# Patient Record
Sex: Male | Born: 2000 | Race: White | Hispanic: No | Marital: Single | Smoking: Current some day smoker
Health system: Southern US, Community
[De-identification: ages and names within clinical notes are randomized; demographics above are authoritative.]

## PROBLEM LIST (undated history)

## (undated) HISTORY — PX: HAND SURGERY: SHX662

---
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
## 2020-03-10 ENCOUNTER — Emergency Department (HOSPITAL_COMMUNITY): Payer: Commercial Managed Care - PPO

## 2020-03-10 ENCOUNTER — Other Ambulatory Visit: Payer: Self-pay

## 2020-03-10 ENCOUNTER — Emergency Department (HOSPITAL_COMMUNITY)
Admission: EM | Admit: 2020-03-10 | Discharge: 2020-03-10 | Disposition: A | Discharge status: Home / Self Care | Payer: Commercial Managed Care - PPO | Attending: Emergency Medicine | Admitting: Emergency Medicine

## 2020-03-10 ENCOUNTER — Encounter (HOSPITAL_COMMUNITY): Payer: Self-pay | Admitting: *Deleted

## 2020-03-10 DIAGNOSIS — F17210 Nicotine dependence, cigarettes, uncomplicated: Secondary | ICD-10-CM | POA: Insufficient documentation

## 2020-03-10 DIAGNOSIS — R0789 Other chest pain: Secondary | ICD-10-CM | POA: Insufficient documentation

## 2020-03-10 LAB — CBC WITH DIFFERENTIAL/PLATELET
Abs Immature Granulocytes: 0.02 10*3/uL (ref 0.00–0.07)
Basophils Absolute: 0.1 10*3/uL (ref 0.0–0.1)
Basophils Relative: 1 %
Eosinophils Absolute: 0.2 10*3/uL (ref 0.0–0.5)
Eosinophils Relative: 2 %
HCT: 44.8 % (ref 39.0–52.0)
Hemoglobin: 15.5 g/dL (ref 13.0–17.0)
Immature Granulocytes: 0 %
Lymphocytes Relative: 37 %
Lymphs Abs: 3.1 10*3/uL (ref 0.7–4.0)
MCH: 29.9 pg (ref 26.0–34.0)
MCHC: 34.6 g/dL (ref 30.0–36.0)
MCV: 86.3 fL (ref 80.0–100.0)
Monocytes Absolute: 0.8 10*3/uL (ref 0.1–1.0)
Monocytes Relative: 9 %
Neutro Abs: 4.4 10*3/uL (ref 1.7–7.7)
Neutrophils Relative %: 51 %
Platelets: 288 10*3/uL (ref 150–400)
RBC: 5.19 MIL/uL (ref 4.22–5.81)
RDW: 11.7 % (ref 11.5–15.5)
WBC: 8.6 10*3/uL (ref 4.0–10.5)
nRBC: 0 % (ref 0.0–0.2)

## 2020-03-10 LAB — TROPONIN I (HIGH SENSITIVITY): Troponin I (High Sensitivity): 4 ng/L (ref <18)

## 2020-03-10 LAB — BASIC METABOLIC PANEL
Anion gap: 11 (ref 5–15)
BUN: 13 mg/dL (ref 6–20)
CO2: 25 mmol/L (ref 22–32)
Calcium: 9.2 mg/dL (ref 8.9–10.3)
Chloride: 101 mmol/L (ref 98–111)
Creatinine, Ser: 0.89 mg/dL (ref 0.61–1.24)
GFR calc Af Amer: >60 mL/min (ref >60)
GFR calc non Af Amer: >60 mL/min (ref >60)
Glucose, Bld: 94 mg/dL (ref 70–99)
Potassium: 3.6 mmol/L (ref 3.5–5.1)
Sodium: 137 mmol/L (ref 135–145)

## 2020-03-10 MED ORDER — ACETAMINOPHEN 500 MG PO TABS: 500.0000 mg | ORAL_TABLET | Freq: Four times a day (QID) | ORAL | 0 refills | Status: AC | PRN

## 2020-03-10 MED ORDER — ONDANSETRON HCL 4 MG PO TABS: 4.0000 mg | ORAL_TABLET | Freq: Three times a day (TID) | ORAL | 0 refills | Status: AC | PRN

## 2020-03-10 MED ORDER — LIDOCAINE 4 % EX CREA: 1.0000 "application " | TOPICAL_CREAM | Freq: Three times a day (TID) | CUTANEOUS | 0 refills | Status: AC | PRN

## 2020-03-10 MED ORDER — NAPROXEN 250 MG PO TABS
500.0000 mg | ORAL_TABLET | Freq: Once | ORAL | Status: AC
Start: 2020-03-10 — End: 2020-03-10
  Administered 2020-03-10: 500 mg via ORAL
  Filled 2020-03-10: qty 2

## 2020-03-10 MED ORDER — PREDNISONE 10 MG PO TABS: 20.0000 mg | ORAL_TABLET | Freq: Two times a day (BID) | ORAL | 0 refills | Status: AC

## 2020-03-10 MED ORDER — ONDANSETRON 4 MG PO TBDP
4.0000 mg | ORAL_TABLET | Freq: Once | ORAL | Status: AC
  Administered 2020-03-10: 4 mg via ORAL
  Filled 2020-03-10: qty 1

## 2020-03-10 NOTE — Discharge Instructions (Signed)
1. Medications: Take steroid taper as prescribed with food to avoid upset stomach issues.  Do not take ibuprofen, Advil, Aleve, or Motrin while taking this medicine.  You may take [phone] mg of Tylenol every 6 hours as needed for pain. Do not exceed 4000 mg of Tylenol daily.  When you are done taking the steroid taper then you can start alternating 600 mg of ibuprofen and [phone] mg of Tylenol every 3 hours as needed for pain. Do not exceed 4000 mg of Tylenol daily.  You can take Zofran as needed for vomiting. 2. Treatment: rest, apply ice or heat whichever feels best 20 minutes at a time a few times daily.  Do some gentle stretching.  You can apply lidocaine cream or other topical creams such as Salonpas or icy hot. 3. Follow Up: Please followup with  PCP in 1 week if no improvement for discussion of your diagnoses and further evaluation after today's visit; if you do not have a primary care doctor use the resource guide provided to find one; Please return to the ER for worsening symptoms or other concerns such as fevers, severe pain, shortness of breath, persistent vomiting

## 2020-03-10 NOTE — ED Notes (Signed)
Blood drawn via 23 gauge butterfly from R ac.  Blood drawn and walked to the lab

## 2020-03-10 NOTE — ED Provider Notes (Signed)
[facility] EMERGENCY DEPARTMENT Provider Note   CSN: [id number] Arrival date & time: 03/10/20  1709     History Chief Complaint  Patient presents with  . Chest Pain    Grant Drake is a 19 y.o. male with history of major depressive disorder presenting for evaluation of acute onset, intermittent left-sided chest pains for 2 weeks.  He reports that the pain is sharp, begins along the inferior aspect of the left lateral chest wall radiating up to the left side of the chest along the sternum.  Mostly sharp along this region but along the rest of the left side of the chest it feels more like pressure he states.  It would last for a few seconds up to a few minutes.  He is able to elicit the pain by taking a deep breath and holding it for "a while".  Today he experienced the pain a little bit more persistently and felt a heaviness to the left upper extremity.  He has had some nausea but no vomiting.  He tells me he will sometimes feel a little short of breath with this but has difficulty characterizing when.  He denies any recent travel or surgeries, hemoptysis, prior history of DVT or PE, leg swelling, or hormone replacement therapy.  He is a non-smoker, denies recreational drug use.  He reports that his biological father has had several heart attacks but is unsure when he had his first heart attack.  His mother has hypertension and a heart murmur.  He has taken Tylenol for his symptoms previously with improvement.   The history is provided by the patient.       History reviewed. No pertinent past medical history.  Patient Active Problem List   Diagnosis Date Noted  . MDD (major depressive disorder), single episode, severe , no psychosis (HCC) 11/27/2017    Past Surgical History:  Procedure Laterality Date  . HAND SURGERY         History reviewed. No pertinent family history.  Social History   Tobacco Use  . Smoking status: Current Some Day Smoker  . Smokeless tobacco: Former  Engineer, water Use Topics  . Alcohol use: No  . Drug use: No    Home Medications Prior to Admission medications   Medication Sig Start Date End Date Taking? Authorizing Provider  acetaminophen (TYLENOL) 500 MG tablet Take 1 tablet (500 mg total) by mouth every 6 (six) hours as needed. 03/10/20   Fleet Higham A, PA-C  lidocaine (LMX) 4 % cream Apply 1 application topically 3 (three) times daily as needed. 03/10/20   Luevenia Maxin, Farrell Pantaleo A, PA-C  ondansetron (ZOFRAN) 4 MG tablet Take 1 tablet (4 mg total) by mouth every 8 (eight) hours as needed for nausea or vomiting. 03/10/20   Luevenia Maxin, Dorissa Stinnette A, PA-C  predniSONE (DELTASONE) 10 MG tablet Take 2 tablets (20 mg total) by mouth 2 (two) times daily with a meal for 5 days. 03/10/20 03/15/20  Michela Pitcher A, PA-C    Allergies    Patient has no known allergies.  Review of Systems   Review of Systems  Constitutional: Negative for chills and fever.  Respiratory: Positive for shortness of breath.   Cardiovascular: Positive for chest pain. Negative for leg swelling.  Gastrointestinal: Positive for nausea. Negative for abdominal pain and vomiting.  Neurological: Negative for syncope and light-headedness.  All other systems reviewed and are negative.   Physical Exam Updated Vital Signs BP (!) 105/55 (BP Location: Right Arm)   Pulse  62   Temp 98.9 F (37.2 C) (Oral)   Resp 17   Ht 6' (1.829 m)   Wt 102.1 kg   SpO2 99%   BMI 30.52 kg/m   Physical Exam Vitals and nursing note reviewed.  Constitutional:      General: He is not in acute distress.    Appearance: He is well-developed.  HENT:     Head: Normocephalic and atraumatic.  Eyes:     General:        Right eye: No discharge.        Left eye: No discharge.     Conjunctiva/sclera: Conjunctivae normal.  Neck:     Vascular: No JVD.     Trachea: No tracheal deviation.  Cardiovascular:     Rate and Rhythm: Normal rate and regular rhythm.     Pulses:          Radial pulses are 2+ on the right  side and 2+ on the left side.       Dorsalis pedis pulses are 2+ on the right side and 2+ on the left side.       Posterior tibial pulses are 2+ on the right side and 2+ on the left side.     Comments: Homans sign absent bilaterally, no lower extremity edema, no palpable cords, compartments are soft  Pulmonary:     Effort: Pulmonary effort is normal.     Comments: Speaking in full sentences without difficulty, SPO2 saturations 100% on room air Chest:     Chest wall: Tenderness present.       Comments: Diffuse left sided chest wall tenderness to palpation with no deformity, crepitus, ecchymosis or flail segment. Abdominal:     General: There is no distension.     Palpations: Abdomen is soft.     Tenderness: There is no abdominal tenderness.  Musculoskeletal:     Cervical back: Normal range of motion and neck supple.     Right lower leg: No tenderness. No edema.     Left lower leg: No tenderness. No edema.  Skin:    General: Skin is warm and dry.     Findings: No erythema.  Neurological:     Mental Status: He is alert.  Psychiatric:        Behavior: Behavior normal.     ED Results / Procedures / Treatments   Labs (all labs ordered are listed, but only abnormal results are displayed) Labs Reviewed  CBC WITH DIFFERENTIAL/PLATELET  BASIC METABOLIC PANEL  TROPONIN I (HIGH SENSITIVITY)    EKG EKG Interpretation  Date/Time:  Friday Mar 10 2020 17:20:08 EDT Ventricular Rate:  72 PR Interval:  158 QRS Duration: 90 QT Interval:  352 QTC Calculation: 385 R Axis:   86 Text Interpretation: Normal sinus rhythm with sinus arrhythmia Normal ECG Confirmed by Thayer Jew [phone]) on 03/11/2020 8:24:44 AM   Radiology DG Chest 2 View  Result Date: 03/10/2020 CLINICAL DATA:  Chest pain for 1 week EXAM: CHEST - 2 VIEW COMPARISON:  None. FINDINGS: The heart size and mediastinal contours are within normal limits. Both lungs are clear. The visualized skeletal structures are  unremarkable. IMPRESSION: No active cardiopulmonary disease. Electronically Signed   By: Inez Catalina M.D.   On: 03/10/2020 20:26    Procedures Procedures (including critical care time)  Medications Ordered in ED Medications  naproxen (NAPROSYN) tablet 500 mg (500 mg Oral Given 03/10/20 1938)  ondansetron (ZOFRAN-ODT) disintegrating tablet 4 mg (4 mg Oral Given 03/10/20 1938)  ED Course  I have reviewed the triage vital signs and the nursing notes.  Pertinent labs & imaging results that were available during my care of the patient were reviewed by me and considered in my medical decision making (see chart for details).    MDM Rules/Calculators/A&P                      Patient presenting for evaluation of intermittent left-sided chest pains for 1 to 2 weeks.  Patient is afebrile, vital signs are stable.  He is nontoxic in appearance.  The pain is very much so reproducible on palpation.  No history of trauma but was very active at his previous job which he quit recently and started a new job.  EKG shows normal sinus rhythm with no ST segment abnormality or significant arrhythmia.  Chest x-ray shows no acute cardiopulmonary abnormalities, no evidence of rib fracture or pneumothorax.  Lab work reviewed and interpreted by myself shows no leukocytosis, no anemia, no metabolic derangements, no renal insufficiency.  A troponin was obtained which was negative.  Given the atypical nature of the patient's symptoms, the duration that the symptoms have been present, lack of risk factors for ACS/MI and given the pain is more likely musculoskeletal in etiology I do not feel that he requires serial troponins.  Doubt ACS/MI at this time.  Also doubt PE, cardiac tamponade, esophageal rupture, or pneumonia.  Patient had improvement in symptoms with naproxen and Zofran.  Will discharge with prednisone burst, Zofran for nausea, topical lidocaine.  Discussed conservative therapy and management of what is most likely  musculoskeletal pain.  Recommend follow-up with PCP for reevaluation of symptoms.  Discussed strict ED return precautions.  Patient and significant other verbalized understanding of and agreement with plan and patient is stable for discharge at this time.   Final Clinical Impression(s) / ED Diagnoses Final diagnoses:  Atypical chest pain    Rx / DC Orders ED Discharge Orders         Ordered    predniSONE (DELTASONE) 10 MG tablet  2 times daily with meals     03/10/20 2100    acetaminophen (TYLENOL) 500 MG tablet  Every 6 hours PRN     03/10/20 2100    ondansetron (ZOFRAN) 4 MG tablet  Every 8 hours PRN     03/10/20 2100    lidocaine (LMX) 4 % cream  3 times daily PRN     03/10/20 2100           Jeanie Sewer, PA-C 03/11/20 1332    Bethann Berkshire, MD 03/13/20 [phone]

## 2020-03-10 NOTE — ED Triage Notes (Signed)
States pain in left side of chest for over a week, states chest is swollen

## 2021-01-12 IMAGING — DX DG CHEST 2V
2 series · 2 of 2 positions shown · non-contrast
Comparison: None.

CLINICAL DATA: Chest pain for 1 week

EXAM:
CHEST - 2 VIEW

[chest pa]
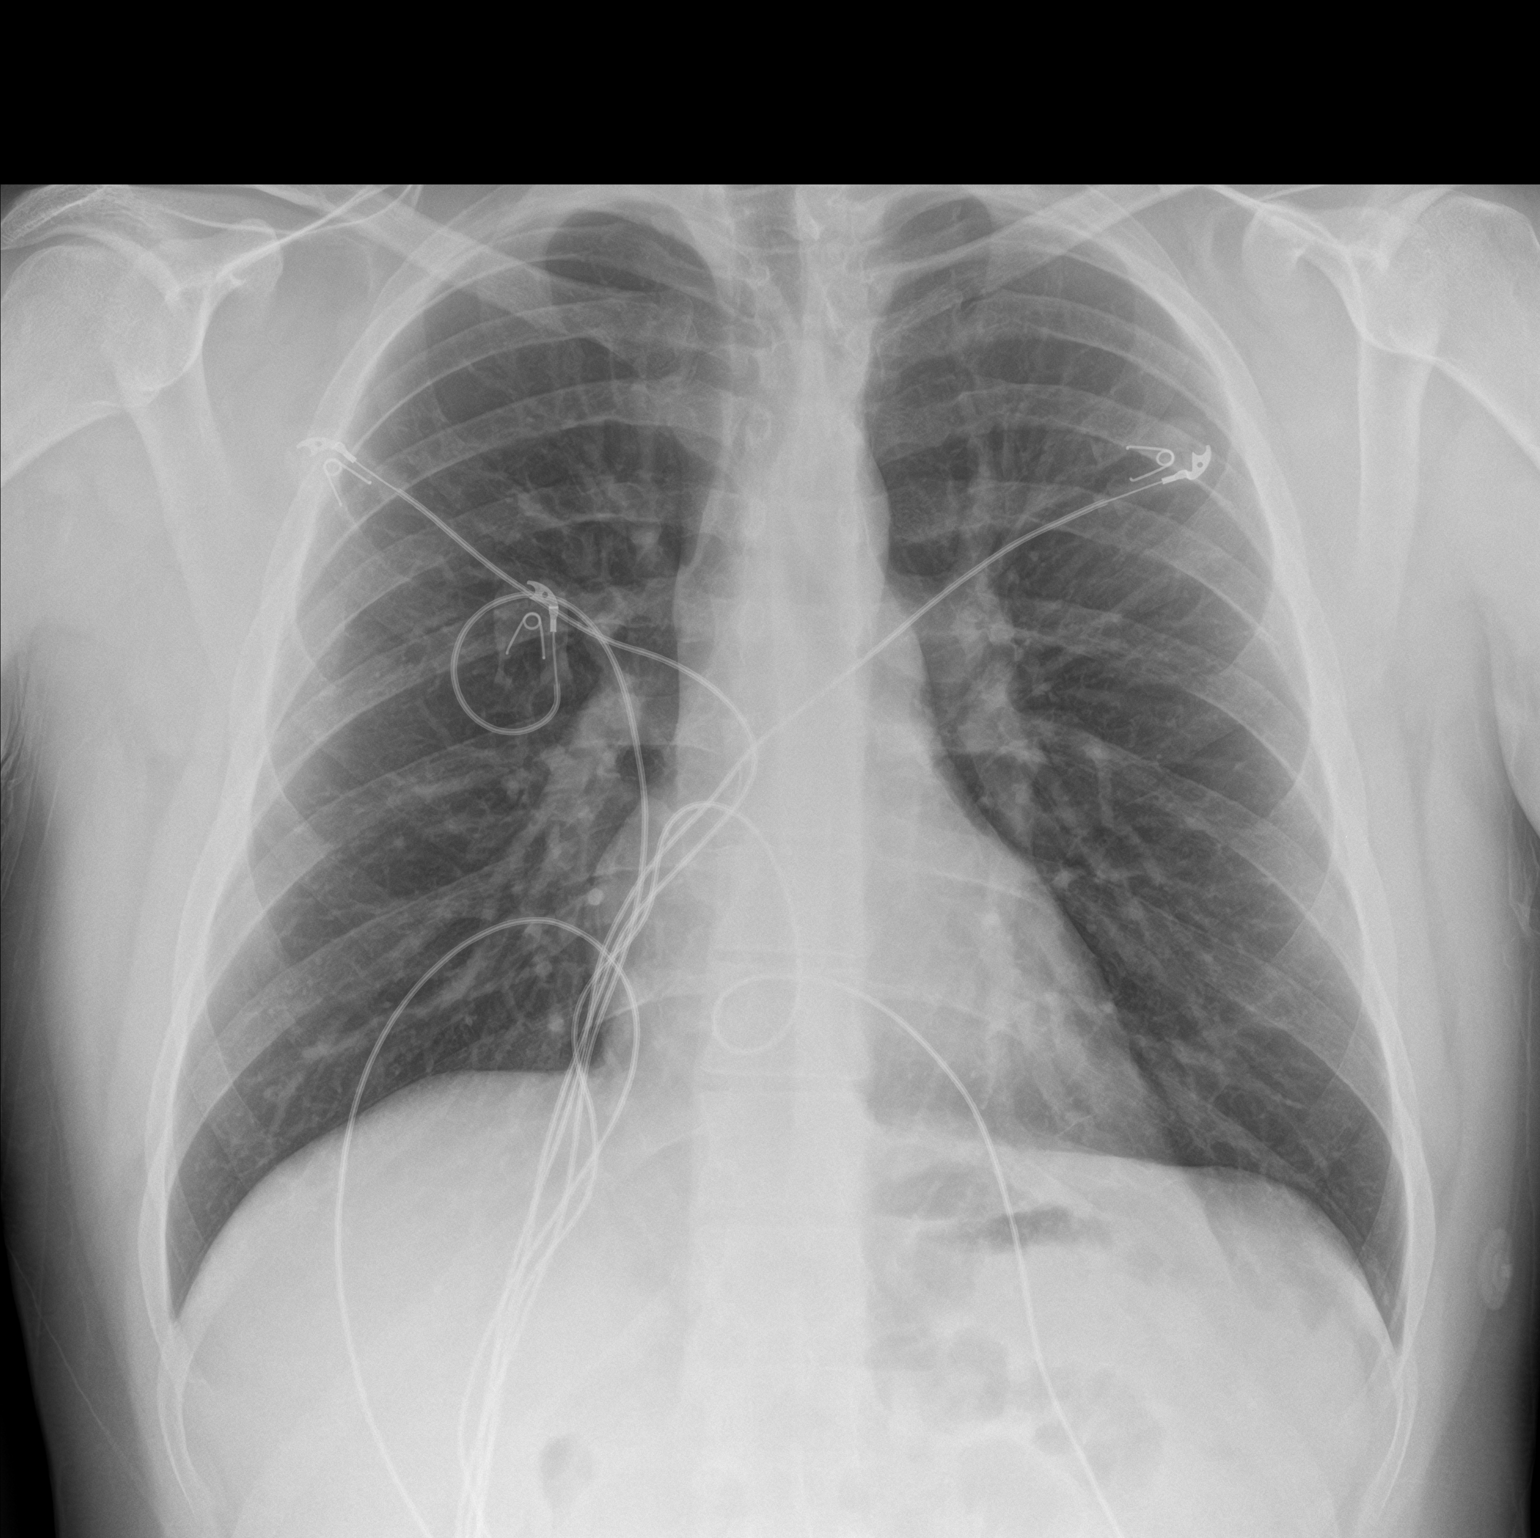

[chest lat]
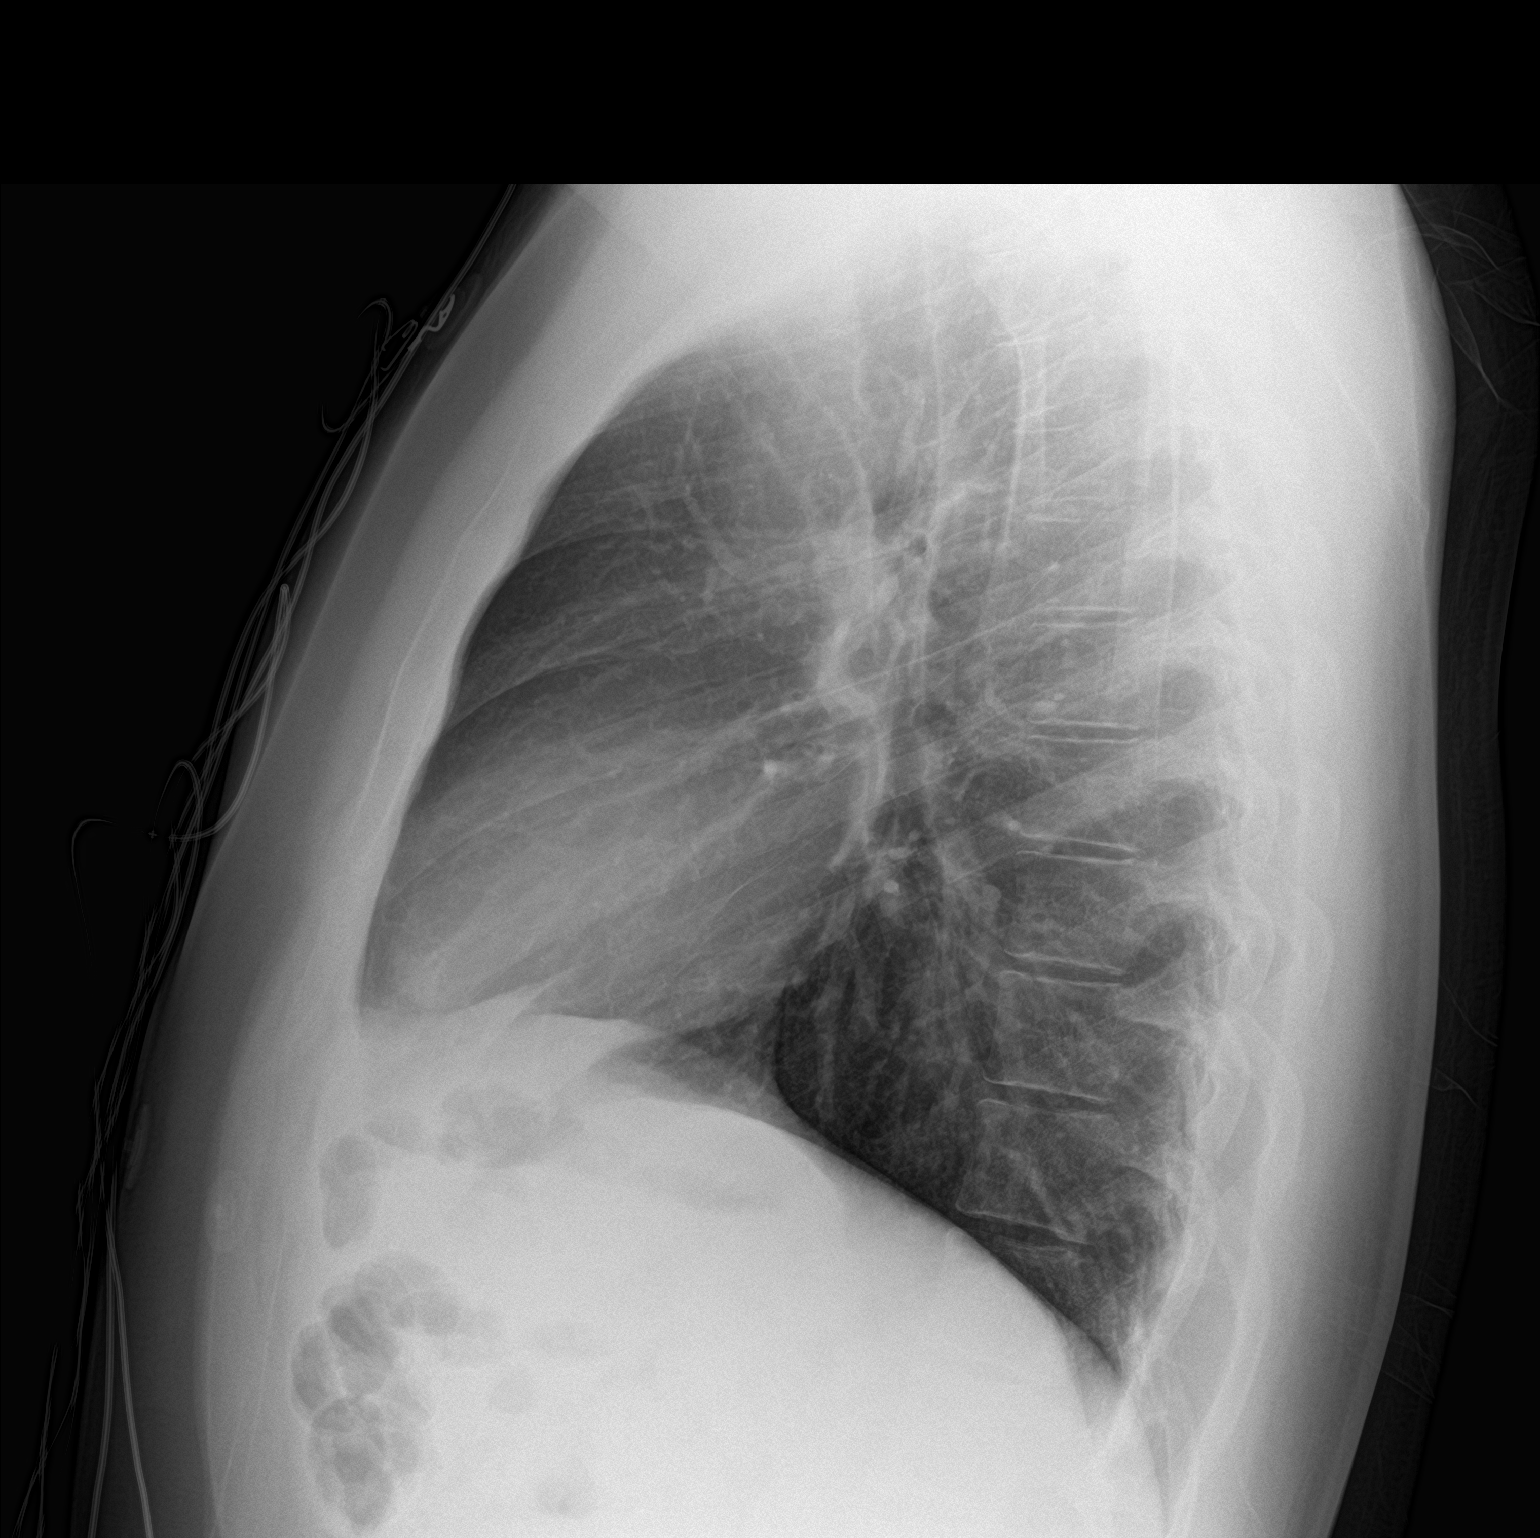

[2 of 2 positions shown; findings below may reference images not displayed]

FINDINGS: The heart size and mediastinal contours are within normal limits.
Both lungs are clear. The visualized skeletal structures are
unremarkable.
IMPRESSION: No active cardiopulmonary disease.
